# Patient Record
Sex: Male | Born: 2000 | Race: Black or African American | Hispanic: No | Marital: Single | Smoking: Never smoker
Health system: Southern US, Community
[De-identification: ages and names within clinical notes are randomized; demographics above are authoritative.]

## PROBLEM LIST (undated history)

## (undated) DIAGNOSIS — F909 Attention-deficit hyperactivity disorder, unspecified type: Secondary | ICD-10-CM

## (undated) DIAGNOSIS — J302 Other seasonal allergic rhinitis: Secondary | ICD-10-CM

## (undated) HISTORY — PX: CIRCUMCISION: SUR203

---
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
## 2024-06-06 ENCOUNTER — Ambulatory Visit (HOSPITAL_COMMUNITY): Payer: Self-pay

## 2024-06-06 ENCOUNTER — Other Ambulatory Visit: Payer: Self-pay

## 2024-06-06 ENCOUNTER — Emergency Department (HOSPITAL_COMMUNITY)
Admission: EM | Admit: 2024-06-06 | Discharge: 2024-06-06 | Disposition: A | Discharge status: Home / Self Care | Attending: Emergency Medicine | Admitting: Emergency Medicine

## 2024-06-06 DIAGNOSIS — R300 Dysuria: Secondary | ICD-10-CM | POA: Insufficient documentation

## 2024-06-06 LAB — URINALYSIS, ROUTINE W REFLEX MICROSCOPIC
Bacteria, UA: NONE SEEN
Bilirubin Urine: NEGATIVE
Glucose, UA: NEGATIVE mg/dL
Hgb urine dipstick: NEGATIVE
Ketones, ur: NEGATIVE mg/dL
Nitrite: NEGATIVE
Protein, ur: 30 mg/dL — AB
Specific Gravity, Urine: 1.026 (ref 1.005–1.030)
WBC, UA: >50 WBC/hpf (ref 0–5)
pH: 5 (ref 5.0–8.0)

## 2024-06-06 LAB — GC/CHLAMYDIA PROBE AMP (~~LOC~~) NOT AT ARMC
Chlamydia: POSITIVE — AB
Comment: NEGATIVE
Comment: NORMAL
Neisseria Gonorrhea: NEGATIVE

## 2024-06-06 MED ORDER — DOXYCYCLINE HYCLATE 100 MG PO TABS: 100.0000 mg | ORAL_TABLET | Freq: Two times a day (BID) | ORAL | 0 refills | Status: DC

## 2024-06-06 MED ORDER — LIDOCAINE HCL (PF) 1 % IJ SOLN
1.0000 mL | Freq: Once | INTRAMUSCULAR | Status: AC
  Administered 2024-06-06: 2 mL
  Filled 2024-06-06: qty 5

## 2024-06-06 MED ORDER — CEFTRIAXONE SODIUM 500 MG IJ SOLR
500.0000 mg | Freq: Once | INTRAMUSCULAR | Status: AC
  Administered 2024-06-06: 500 mg via INTRAMUSCULAR
  Filled 2024-06-06: qty 500

## 2024-06-06 MED ORDER — DOXYCYCLINE HYCLATE 100 MG PO TABS: 100.0000 mg | ORAL_TABLET | Freq: Two times a day (BID) | ORAL | 0 refills | Status: AC

## 2024-06-06 NOTE — ED Triage Notes (Signed)
 Patient reports dysuria  it burns onset 3 weeks ago , denies hematuria.

## 2024-06-06 NOTE — ED Provider Notes (Signed)
 [place] EMERGENCY DEPARTMENT AT [facility] Provider Note  CSN: [id number] Arrival date & time: 06/06/24 9956  Chief Complaint(s) Dysuria  HPI Lawrence Beck is a 23 y.o. male     Dysuria Presenting symptoms: dysuria   Presenting symptoms: no penile discharge, no penile pain, no scrotal pain and no swelling   Context: during urination   Associated symptoms: no abdominal pain, no groin pain and no scrotal swelling   Risk factors: STI exposure and unprotected sex     Past Medical History Past Medical History:  Diagnosis Date   ADHD (attention deficit hyperactivity disorder)    Seasonal allergies    There are no active problems to display for this patient.  Home Medication(s) Prior to Admission medications   Medication Sig Start Date End Date Taking? Authorizing Provider  doxycycline (VIBRA-TABS) 100 MG tablet Take 1 tablet (100 mg total) by mouth 2 (two) times daily. 06/06/24  Yes Gregory Dowe, Raynell Moder, MD  methylphenidate 36 MG PO CR tablet Take 36 mg by mouth daily.    [provider]                                                                                                                                    Allergies Patient has no known allergies.  Review of Systems Review of Systems  Gastrointestinal:  Negative for abdominal pain.  Genitourinary:  Positive for dysuria. Negative for penile discharge, penile pain and scrotal swelling.   As noted in HPI  Physical Exam Vital Signs  I have reviewed the triage vital signs BP 110/76 (BP Location: Right Arm)   Pulse 77   Temp 98.4 F (36.9 C) (Oral)   Resp 16   SpO2 98%   Physical Exam Vitals reviewed. Exam conducted with a chaperone present.  Constitutional:      General: He is not in acute distress.    Appearance: He is well-developed. He is not diaphoretic.  HENT:     Head: Normocephalic and atraumatic.     Right Ear: External ear normal.     Left Ear: External ear normal.     Nose:  Nose normal.     Mouth/Throat:     Mouth: Mucous membranes are moist.  Eyes:     General: No scleral icterus.    Conjunctiva/sclera: Conjunctivae normal.  Neck:     Trachea: Phonation normal.  Cardiovascular:     Rate and Rhythm: Normal rate and regular rhythm.  Pulmonary:     Effort: Pulmonary effort is normal. No respiratory distress.     Breath sounds: No stridor.  Abdominal:     General: There is no distension.  Genitourinary:    Penis: Circumcised. No erythema, tenderness, discharge, swelling or lesions.   Musculoskeletal:        General: Normal range of motion.     Cervical back: Normal range of motion.  Neurological:     Mental  Status: He is alert and oriented to person, place, and time.  Psychiatric:        Behavior: Behavior normal.     ED Results and Treatments Labs (all labs ordered are listed, but only abnormal results are displayed) Labs Reviewed  URINALYSIS, ROUTINE W REFLEX MICROSCOPIC - Abnormal; Notable for the following components:      Result Value   APPearance HAZY (*)    Protein, ur 30 (*)    Leukocytes,Ua MODERATE (*)    All other components within normal limits  GC/CHLAMYDIA PROBE AMP ([place]) NOT AT [facility]                                                                                                                         EKG  EKG Interpretation Date/Time:    Ventricular Rate:    PR Interval:    QRS Duration:    QT Interval:    QTC Calculation:   R Axis:      Text Interpretation:         Radiology No results found.  Medications Ordered in ED Medications  cefTRIAXone (ROCEPHIN) injection 500 mg (has no administration in time range)  lidocaine (PF) (XYLOCAINE) 1 % injection 1-2.1 mL (has no administration in time range)   Procedures Procedures  (including critical care time) Medical Decision Making / ED Course   Medical Decision Making Amount and/or Complexity of Data Reviewed Labs: ordered.  Risk Prescription drug  management.    UA with elevated WBCs no bacteria. Significant other treated for STI Will treat as well.    Final Clinical Impression(s) / ED Diagnoses Final diagnoses:  Dysuria   The patient appears reasonably screened and/or stabilized for discharge and I doubt any other medical condition or other [facility] requiring further screening, evaluation, or treatment in the ED at this time. I have discussed the findings, Dx and Tx plan with the patient/family who expressed understanding and agree(s) with the plan. Discharge instructions discussed at length. The patient/family was given strict return precautions who verbalized understanding of the instructions. No further questions at time of discharge.  Disposition: Discharge  Condition: Good  ED Discharge Orders          Ordered    doxycycline (VIBRA-TABS) 100 MG tablet  2 times daily        06/06/24 0426              This chart was dictated using voice recognition software.  Despite best efforts to proofread,  errors can occur which can change the documentation meaning.    Trine Raynell Moder, MD 06/06/24 0430

## 2024-06-06 NOTE — Discharge Instructions (Signed)
 Lawrence Beck
